# Patient Record
Sex: Female | Born: 1995 | Race: White | Hispanic: No | Marital: Single | Smoking: Never smoker
Health system: Southern US, Community
[De-identification: ages and names within clinical notes are randomized; demographics above are authoritative.]

## PROBLEM LIST (undated history)

## (undated) DIAGNOSIS — L709 Acne, unspecified: Secondary | ICD-10-CM

## (undated) DIAGNOSIS — E785 Hyperlipidemia, unspecified: Secondary | ICD-10-CM

## (undated) DIAGNOSIS — Z8742 Personal history of other diseases of the female genital tract: Secondary | ICD-10-CM

## (undated) DIAGNOSIS — F419 Anxiety disorder, unspecified: Secondary | ICD-10-CM

## (undated) HISTORY — PX: OTHER SURGICAL HISTORY: SHX169

## (undated) HISTORY — DX: Personal history of other diseases of the female genital tract: Z87.42

## (undated) HISTORY — DX: Anxiety disorder, unspecified: F41.9

## (undated) HISTORY — DX: Hyperlipidemia, unspecified: E78.5

## (undated) HISTORY — DX: Acne, unspecified: L70.9

---
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
## 2016-05-18 ENCOUNTER — Other Ambulatory Visit: Payer: Self-pay | Admitting: Obstetrics and Gynecology

## 2016-05-18 ENCOUNTER — Ambulatory Visit (INDEPENDENT_AMBULATORY_CARE_PROVIDER_SITE_OTHER): Payer: Commercial Managed Care - PPO | Admitting: Obstetrics and Gynecology

## 2016-05-18 ENCOUNTER — Encounter: Payer: Self-pay | Admitting: Obstetrics and Gynecology

## 2016-05-18 VITALS — BP 116/60 | HR 66 | Resp 20 | Ht 64.0 in | Wt 127.2 lb

## 2016-05-18 DIAGNOSIS — E282 Polycystic ovarian syndrome: Secondary | ICD-10-CM

## 2016-05-18 DIAGNOSIS — L680 Hirsutism: Secondary | ICD-10-CM | POA: Diagnosis not present

## 2016-05-18 MED ORDER — GILDAGIA 0.4-35 MG-MCG PO TABS: 1.0000 | ORAL_TABLET | Freq: Every day | ORAL | Status: DC

## 2016-05-18 NOTE — Patient Instructions (Signed)
Eflornithine skin cream What is this medicine? EFLORNITHINE (ee FLOR ni theen) is used on the skin to reduce unwanted facial hair in women. This medicine may be used for other purposes; ask your health care provider or pharmacist if you have questions. What should I tell my health care provider before I take this medicine? They need to know if you have any of these conditions: -an unusual or allergic reaction to eflornithine, other medicines, foods, dyes, or preservatives -pregnant or trying to get pregnant -breast-feeding How should I use this medicine? This medicine is for external use only. Do not take by mouth. Follow the direction on the prescription label. Wash your hands before and after use. Apply a thin film of cream to the affected areas of the face and chin twice a day (at least 8 hours apart), or as often as directed by your doctor or health care professional. Rub the cream in thoroughly. Do not wash the treatment area for at least 4 hours after application of the cream. Wait a few minutes after applying this cream before you apply cosmetics or sunscreens. This cream does not permanently remove unwanted facial hair. Continue to use your normal method for hair removal until desired results have been achieved. Do not use your medicine more often than directed. Talk to your pediatrician regarding the use of this medicine in children. Special care may be needed. While this drug may be prescribed for girls as young as 19 years of age for selected conditions, precautions do apply. Overdosage: If you think you have taken too much of this medicine contact a poison control center or emergency room at once. NOTE: This medicine is only for you. Do not share this medicine with others. What if I miss a dose? If you miss a dose, use it as soon as you can. If it is almost time for your next dose, use only that dose. Do not use double or extra doses. What may interact with this medicine? Interactions  are not expected. Do not use any other skin products on the affected area without asking your doctor or health care professional. This list may not describe all possible interactions. Give your health care provider a list of all the medicines, herbs, non-prescription drugs, or dietary supplements you use. Also tell them if you smoke, drink alcohol, or use illegal drugs. Some items may interact with your medicine. What should I watch for while using this medicine? Improvement of the condition occurs gradually. It may take 4 to 8 weeks. If there is no improvement after 6 months, talk to your doctor about stopping this medicine. About 8 weeks after stopping treatment with this medicine, the hair will return to the same condition as before treatment. What side effects may I notice from receiving this medicine? Side effects that you should report to your doctor or health care professional as soon as possible: -allergic reactions like skin rash, itching or hives, swelling of the face, lips, or tongue Side effects that usually do not require medical attention (report to your doctor or health care professional if they continue or are bothersome): -hair bumps -redness -stinging or burning This list may not describe all possible side effects. Call your doctor for medical advice about side effects. You may report side effects to FDA at 1-800-FDA-1088. Where should I keep my medicine? Keep out of the reach of children. Store at room temperature between 15 and 30 degrees C (59 and 86 degrees F). Do not freeze. Throw away any  unused medicine after the expiration date. NOTE: This sheet is a summary. It may not cover all possible information. If you have questions about this medicine, talk to your doctor, pharmacist, or health care provider.    2016, Elsevier/Gold Standard. (2008-03-26 17:12:20)  Spironolactone tablets What is this medicine? SPIRONOLACTONE (speer on oh LAK tone) is a diuretic. It helps you  make more urine and to lose excess water from your body. This medicine is used to treat high blood pressure, and edema or swelling from heart, kidney, or liver disease. It is also used to treat patients who make too much aldosterone or have low potassium. This medicine may be used for other purposes; ask your health care provider or pharmacist if you have questions. What should I tell my health care provider before I take this medicine? They need to know if you have any of these conditions: -high blood level of potassium -kidney disease or trouble making urine -liver disease -an unusual or allergic reaction to spironolactone, other medicines, foods, dyes, or preservatives -pregnant or trying to get pregnant -breast-feeding How should I use this medicine? Take this medicine by mouth with a drink of water. Follow the directions on your prescription label. You can take it with or without food. If it upsets your stomach, take it with food. Do not take your medicine more often than directed. Remember that you will need to pass more urine after taking this medicine. Do not take your doses at a time of day that will cause you problems. Do not take at bedtime. Talk to your pediatrician regarding the use of this medicine in children. While this drug may be prescribed for selected conditions, precautions do apply. Overdosage: If you think you have taken too much of this medicine contact a poison control center or emergency room at once. NOTE: This medicine is only for you. Do not share this medicine with others. What if I miss a dose? If you miss a dose, take it as soon as you can. If it is almost time for your next dose, take only that dose. Do not take double or extra doses. What may interact with this medicine? Do not take this medicine with any of the following medications: -eplerenone This medicine may also interact with the following medications: -corticosteroids -digoxin -lithium -medicines for  high blood pressure like ACE inhibitors -skeletal muscle relaxants like tubocurarine -NSAIDs, medicines for pain and inflammation, like ibuprofen or naproxen -potassium products like salt substitute or supplements -pressor amines like norepinephrine -some diuretics This list may not describe all possible interactions. Give your health care provider a list of all the medicines, herbs, non-prescription drugs, or dietary supplements you use. Also tell them if you smoke, drink alcohol, or use illegal drugs. Some items may interact with your medicine. What should I watch for while using this medicine? Visit your doctor or health care professional for regular checks on your progress. Check your blood pressure as directed. Ask your doctor what your blood pressure should be, and when you should contact them. You may need to be on a special diet while taking this medicine. Ask your doctor. Also, ask how many glasses of fluid you need to drink a day. You must not get dehydrated. This medicine may make you feel confused, dizzy or lightheaded. Drinking alcohol and taking some medicines can make this worse. Do not drive, use machinery, or do anything that needs mental alertness until you know how this medicine affects you. Do not sit or stand  up quickly. What side effects may I notice from receiving this medicine? Side effects that you should report to your doctor or health care professional as soon as possible: -allergic reactions such as skin rash or itching, hives, swelling of the lips, mouth, tongue, or throat -black or tarry stools -fast, irregular heartbeat -fever -muscle pain, cramps -numbness, tingling in hands or feet -trouble breathing -trouble passing urine -unusual bleeding -unusually weak or tired Side effects that usually do not require medical attention (report to your doctor or health care professional if they continue or are bothersome): -change in voice or hair  growth -confusion -dizzy, drowsy -dry mouth, increased thirst -enlarged or tender breasts -headache -irregular menstrual periods -sexual difficulty, unable to have an erection -stomach upset This list may not describe all possible side effects. Call your doctor for medical advice about side effects. You may report side effects to FDA at 1-800-FDA-1088. Where should I keep my medicine? Keep out of the reach of children. Store below 25 degrees C (77 degrees F). Throw away any unused medicine after the expiration date. NOTE: This sheet is a summary. It may not cover all possible information. If you have questions about this medicine, talk to your doctor, pharmacist, or health care provider.    2016, Elsevier/Gold Standard. (2010-08-05 12:51:30)

## 2016-05-18 NOTE — Progress Notes (Addendum)
Patient ID: Autumn Mills, female   DOB: [birth year], 20 y.o.   MRN: [id number] GYNECOLOGY  VISIT   HPI: 20 y.o.   Single  Caucasian  female   G0P0 with Patient's last menstrual period was 05/05/2016 (exact date).   here to establish care for PCOS.   Has PCOS for about one year. Has facial hair.  Has not done electrolysis or waxing.  A little midline chest hair.  Menses have always been regular; "intense" when off OCPs.  Saw Elon Alas previously. Had ultrasound in 2015 - multiple bilateral ovarian follicles. Labs at that time: Testosterone 49.  DHEA and TSH normal. Started OCPs. Declined spironolactone.  Has been treating with birth control pills but recently ran out.  Just went back on them one month ago.   Prefers to take less medication versus more.   Patient's PCP is "[facility]" at Dr. Ray Church office.  Had routine blood work, which is not back yet.  No hormonal testing was done.  Has skin checks yearly at [facility].   Studying history and Spanish.  Student At [employer]. Working at her Engineering geologist office for the summer.   GYNECOLOGIC HISTORY: Patient's last menstrual period was 05/05/2016 (exact date).  Never sexually active.  Female preference. Contraception:  OCPs--Gildagia Menopausal hormone therapy:  n/a Last mammogram:  n/a Last pap smear:   n/a        OB History    Gravida Para Term Preterm AB TAB SAB Ectopic Multiple Living   0                  Patient Active Problem List   Diagnosis Date Noted  . Hirsutism 07/17/2014    Past Medical History  Diagnosis Date  . Acne   . History of PCOS     Past Surgical History  Procedure Laterality Date  . Injury surg    . Other surgical history Left     golf tee removed from left buttocks age 68    Current Outpatient Prescriptions  Medication Sig Dispense Refill  . GILDAGIA 0.4-35 MG-MCG tablet Take 1 tablet by mouth daily. Reported on 05/18/2016     No current facility-administered medications  for this visit.     ALLERGIES: Review of patient's allergies indicates no known allergies.  Family History  Problem Relation Age of Onset  . Hyperlipidemia Father   . Lupus Maternal Aunt   . Thyroid disease Maternal Grandmother     hashimotos  . Heart disease Maternal Grandmother   . Hyperlipidemia Maternal Grandmother   . Hypertension Maternal Grandmother   . Cancer Maternal Grandfather     Bone  . Multiple sclerosis Paternal Grandmother   . Cancer Paternal Grandfather     Prostate  . Migraines Mother     Social History   Social History  . Marital Status: Single    Spouse Name: N/A  . Number of Children: N/A  . Years of Education: N/A   Occupational History  . Not on file.   Social History Main Topics  . Smoking status: Never Smoker   . Smokeless tobacco: Not on file  . Alcohol Use: No  . Drug Use: No  . Sexual Activity: No     Comment: no sexually active   Other Topics Concern  . Not on file   Social History Narrative    ROS:  Pertinent items are noted in HPI.  PHYSICAL EXAMINATION:    BP 116/60 mmHg  Pulse 66  Resp 20  Ht 5\' 4"  (1.626 m)  Wt 127 lb 3.2 oz (57.698 kg)  BMI 21.82 kg/m2  LMP 05/05/2016 (Exact Date)    General appearance: alert, cooperative and appears stated age Head: Normocephalic, without obvious abnormality, atraumatic Neck: no adenopathy, supple, symmetrical, trachea midline and thyroid normal to inspection and palpation Lungs: clear to auscultation bilaterally Breasts: normal appearance, no masses or tenderness, Inspection negative, No nipple retraction or dimpling, No nipple discharge or bleeding, No axillary or supraclavicular adenopathy, aroelar hair.  Few midline chest haris.  Heart: regular rate and rhythm Abdomen: incision(s):No.   soft, non-tender, no masses,  no organomegaly Extremities: extremities normal, atraumatic, no cyanosis or edema Skin: Acne noted, skin color, texture, turgor normal. No rashes or lesions Lymph  nodes: Cervical, supraclavicular, and axillary nodes normal. No abnormal inguinal nodes palpated Neurologic: Grossly normal  Pelvic: External genitalia:   Tanner Stage V. No full pelvic exam.   Chaperone was present for exam.  ASSESSMENT  Polycystic ovarian disease. Hx mildly elevated total testosterone. Hirsutism.  Acne.   PLAN  Discussion of PCOS.  Check testosterone - free and total, prolactin level.   Believes she had cholesterol, blood counts, blood chemistry testing, and thyroid testing at PCP.  We will get a copy of results. Continue OCPs.  Discussed Vaniqa, spironolactone, and laser/electrolysis for hair growth.   Patient will pursue hair removal through [facility]. Breast self exam taught.  Follow up yearly for annual exams and prn.    An After Visit Summary was printed and given to the patient.  __30____ minutes face to face time of which over 50% was spent in counseling.   Addendum:  Patient's mother was present for the history and discussion portion of the visit.  She was briefly excused so that I could interview the patient alone as well.

## 2016-05-19 LAB — PROLACTIN: Prolactin: 7.8 ng/mL

## 2016-05-21 LAB — TESTOSTERONE, TOTAL, LC/MS/MS: Testosterone, Total, LC-MS-MS: 36 ng/dL (ref 2–45)

## 2016-05-28 ENCOUNTER — Telehealth: Payer: Self-pay

## 2016-05-28 NOTE — Telephone Encounter (Signed)
Called patient at [phone] to discuss lab results; left message for patient to call me.

## 2016-05-28 NOTE — Telephone Encounter (Signed)
Patient notified--see Result Note.

## 2016-05-28 NOTE — Telephone Encounter (Signed)
-----   Message from Nunzio Cobbs, MD sent at 05/26/2016  6:16 PM EDT ----- Please inform patient of her normal total testosterone and prolactin levels.  I am trying to have the lab run a free testosterone also.

## 2016-05-29 LAB — TESTOS,TOTAL,FREE AND SHBG (FEMALE)
SEX HORMONE BINDING GLOB.: 51 nmol/L (ref 17–124)
Testosterone, Free: 3 pg/mL (ref 0.1–6.4)
Testosterone,Total,LC/MS/MS: 40 ng/dL (ref 2–45)

## 2017-04-14 DIAGNOSIS — Z01419 Encounter for gynecological examination (general) (routine) without abnormal findings: Secondary | ICD-10-CM | POA: Diagnosis not present

## 2017-04-14 DIAGNOSIS — Z6820 Body mass index (BMI) 20.0-20.9, adult: Secondary | ICD-10-CM | POA: Diagnosis not present

## 2017-04-21 ENCOUNTER — Encounter: Payer: Self-pay | Admitting: Gynecology

## 2017-05-05 DIAGNOSIS — E559 Vitamin D deficiency, unspecified: Secondary | ICD-10-CM | POA: Diagnosis not present

## 2017-05-05 DIAGNOSIS — E7800 Pure hypercholesterolemia, unspecified: Secondary | ICD-10-CM | POA: Diagnosis not present

## 2017-05-05 DIAGNOSIS — Z0001 Encounter for general adult medical examination with abnormal findings: Secondary | ICD-10-CM | POA: Diagnosis not present

## 2017-05-05 DIAGNOSIS — Z0000 Encounter for general adult medical examination without abnormal findings: Secondary | ICD-10-CM | POA: Diagnosis not present

## 2017-05-11 ENCOUNTER — Other Ambulatory Visit: Payer: Self-pay | Admitting: Obstetrics and Gynecology

## 2017-05-11 NOTE — Telephone Encounter (Signed)
Pt needs AEX scheduled.  #84/0RF done for pt.

## 2017-05-11 NOTE — Telephone Encounter (Signed)
Medication refill request: [id number]  Last AEX:  05/18/16 BS Next AEX: none scheduled Last MMG (if hormonal medication request): n/a Refill authorized: 05/18/16 #3packs w/3 refills; today please advise; Dr. Quincy Simmonds out of office  Left VM for patient to call our office back and schedule AEX.

## 2017-05-12 DIAGNOSIS — Z0000 Encounter for general adult medical examination without abnormal findings: Secondary | ICD-10-CM | POA: Diagnosis not present

## 2017-05-13 DIAGNOSIS — L03031 Cellulitis of right toe: Secondary | ICD-10-CM | POA: Diagnosis not present

## 2017-05-13 DIAGNOSIS — B351 Tinea unguium: Secondary | ICD-10-CM | POA: Diagnosis not present

## 2017-05-13 DIAGNOSIS — M898X9 Other specified disorders of bone, unspecified site: Secondary | ICD-10-CM | POA: Diagnosis not present

## 2017-06-16 DIAGNOSIS — B351 Tinea unguium: Secondary | ICD-10-CM | POA: Diagnosis not present

## 2018-05-17 DIAGNOSIS — L700 Acne vulgaris: Secondary | ICD-10-CM | POA: Diagnosis not present

## 2018-05-26 DIAGNOSIS — Z79899 Other long term (current) drug therapy: Secondary | ICD-10-CM | POA: Diagnosis not present

## 2018-05-26 DIAGNOSIS — L700 Acne vulgaris: Secondary | ICD-10-CM | POA: Diagnosis not present

## 2018-06-28 DIAGNOSIS — L700 Acne vulgaris: Secondary | ICD-10-CM | POA: Diagnosis not present

## 2018-06-28 DIAGNOSIS — Z79899 Other long term (current) drug therapy: Secondary | ICD-10-CM | POA: Diagnosis not present

## 2018-07-18 DIAGNOSIS — Z6820 Body mass index (BMI) 20.0-20.9, adult: Secondary | ICD-10-CM | POA: Diagnosis not present

## 2018-07-18 DIAGNOSIS — Z01419 Encounter for gynecological examination (general) (routine) without abnormal findings: Secondary | ICD-10-CM | POA: Diagnosis not present

## 2018-11-24 DIAGNOSIS — D225 Melanocytic nevi of trunk: Secondary | ICD-10-CM | POA: Diagnosis not present

## 2018-11-24 DIAGNOSIS — B351 Tinea unguium: Secondary | ICD-10-CM | POA: Diagnosis not present

## 2018-11-24 DIAGNOSIS — D2261 Melanocytic nevi of right upper limb, including shoulder: Secondary | ICD-10-CM | POA: Diagnosis not present

## 2019-11-23 ENCOUNTER — Other Ambulatory Visit: Payer: Self-pay | Admitting: Internal Medicine

## 2019-11-23 DIAGNOSIS — R002 Palpitations: Secondary | ICD-10-CM

## 2019-12-29 ENCOUNTER — Other Ambulatory Visit: Payer: Self-pay | Admitting: Internal Medicine

## 2019-12-29 DIAGNOSIS — R002 Palpitations: Secondary | ICD-10-CM

## 2020-01-01 ENCOUNTER — Ambulatory Visit: Payer: Managed Care, Other (non HMO)

## 2020-01-01 ENCOUNTER — Other Ambulatory Visit: Payer: Self-pay

## 2020-01-01 DIAGNOSIS — R002 Palpitations: Secondary | ICD-10-CM

## 2020-11-08 ENCOUNTER — Ambulatory Visit: Payer: Self-pay | Attending: Internal Medicine

## 2020-11-08 DIAGNOSIS — Z23 Encounter for immunization: Secondary | ICD-10-CM

## 2020-11-08 NOTE — Progress Notes (Signed)
° °  Covid-19 Vaccination Clinic  Name:  Autumn Mills    MRN: [id number] DOB: Jul 03, 1996  11/08/2020  Ms. Orser was observed post Covid-19 immunization for 15 minutes without incident. She was provided with Vaccine Information Sheet and instruction to access the V-Safe system.   Ms. Waldvogel was instructed to call 911 with any severe reactions post vaccine:  Difficulty breathing   Swelling of face and throat   A fast heartbeat   A bad rash all over body   Dizziness and weakness   Immunizations Administered    No immunizations on file.

## 2022-07-14 ENCOUNTER — Ambulatory Visit: Payer: 59 | Admitting: Physician Assistant

## 2022-07-28 ENCOUNTER — Encounter: Payer: Self-pay | Admitting: Physician Assistant

## 2022-07-28 ENCOUNTER — Ambulatory Visit: Payer: 59 | Admitting: Physician Assistant

## 2022-07-28 VITALS — BP 120/70 | HR 78 | Temp 98.3°F | Ht 65.0 in | Wt 126.2 lb

## 2022-07-28 DIAGNOSIS — F526 Dyspareunia not due to a substance or known physiological condition: Secondary | ICD-10-CM | POA: Diagnosis not present

## 2022-07-28 DIAGNOSIS — B351 Tinea unguium: Secondary | ICD-10-CM

## 2022-07-28 DIAGNOSIS — E282 Polycystic ovarian syndrome: Secondary | ICD-10-CM

## 2022-07-28 DIAGNOSIS — Z124 Encounter for screening for malignant neoplasm of cervix: Secondary | ICD-10-CM

## 2022-07-28 DIAGNOSIS — M2550 Pain in unspecified joint: Secondary | ICD-10-CM

## 2022-07-28 DIAGNOSIS — R4589 Other symptoms and signs involving emotional state: Secondary | ICD-10-CM | POA: Insufficient documentation

## 2022-07-28 DIAGNOSIS — F418 Other specified anxiety disorders: Secondary | ICD-10-CM

## 2022-07-28 MED ORDER — MELOXICAM 7.5 MG PO TABS: 7.5000 mg | ORAL_TABLET | Freq: Every day | ORAL | 0 refills | Status: DC

## 2022-07-28 NOTE — Progress Notes (Signed)
Autumn Mills is a 26 y.o. female here for a follow up of a pre-existing problem.  History of Present Illness:   Chief Complaint  Patient presents with   Establish Care   Joint Pain    Pt c/o joint pain since May when she was put on anti-fungal medication stopped medication and is still having joint pain shoulders, wrists, stiff fingers left hand ring and middle fingers, knees.   Patient is present with her mother Autumn Mills  HPI  Arthralgias Patient has been dealing with toenail fungus on bilateral great toes for several years.  She discussed this with her primary care provider and started terbinafine daily.  She took this for 2 to 3 weeks and then noticed that she was having some joint pain and stiffness around her right knee and upper thigh.  She saw an orthopedist and was told that she might have some tendinitis.  She had an x-ray of her right leg and this was normal.  The orthopedist told her to stop the terbinafine as it could have potentially been related.  Since that time she has had ongoing joint and muscle pain.  She gets throbbing pain and stiffness.  Her symptoms are in her arms, wrists, shoulders.  She is not experience any redness or swelling.  She trialed naproxen and this helped her symptoms and then she was told to exercise and had improvement in symptoms with this as well.  She went to a hand and shoulder doctor in mid July and he told her that he was unsure of what was causing her symptoms.  She has been taking meloxicam 15 mg daily instead of the naproxen and this is helping her symptoms.  She has several braces with her today, she is wearing 1 on her shoulder, and she also wears 1 on he either her hand or wrist depending on severity of symptoms.  Her mother sees integrative provider and Autumn Mills has also recently seen this provider and has standing blood work to get done to assess for underlying causes of her symptoms but has yet to have this blood work done.  Autumn Mills's  mother suffers from fibromyalgia.  Bilateral great toenail fungus Has ongoing issues with her toenail fungus.  She has a follow-up appointment scheduled with the podiatrist to talk about possibly using lasers to help improve the appearance of her toenails.  PCOS Currently taking ortho-cyclen and spironolactone 100 mg daily. She was told that she has a "touch" of PCOS. Had ovarian cysts found on ultrasound. Currently taking spironolactone 100 mg.  She has been unable to have a Pap smear due to severe pain with attempts at the speculum exam.  She is looking for a new gynecologist.  Health Anxiety Has severe health anxiety.  Her mother reports that when she is having physical symptoms that her mental health spins out of control as she tries to figure out what is wrong with her.  Patient endorses significant health anxiety at times.  She has never been on medication.  She saw a talk therapist once and it was not a good fit for her.  She denies any suicidal or homicidal ideation.    Past Medical History:  Diagnosis Date   Anxiety    Hyperlipidemia      Social History   Tobacco Use   Smoking status: Never   Smokeless tobacco: Never  Vaping Use   Vaping Use: Never used  Substance Use Topics   Alcohol use: Not Currently   Drug use: Never  Past Surgical History:  Procedure Laterality Date   gluteal surgery Left 2004    History reviewed. No pertinent family history.  No Known Allergies  Current Medications:   Current Outpatient Medications:    meloxicam (MOBIC) 15 MG tablet, Take 15 mg by mouth daily., Disp: , Rfl:    norgestimate-ethinyl estradiol (ORTHO-CYCLEN) 0.25-35 MG-MCG tablet, Take 1 tablet by mouth daily., Disp: , Rfl:    spironolactone (ALDACTONE) 100 MG tablet, Take 100 mg by mouth daily., Disp: , Rfl:    Review of Systems:   ROS Negative unless otherwise specified per HPI.   Vitals:   Vitals:   07/28/22 1335  BP: 120/70  Pulse: 78  Temp: 98.3 F (36.8  C)  TempSrc: Temporal  SpO2: 98%  Weight: 126 lb 4 oz (57.3 kg)  Height: '5\' 5"'$  (1.651 m)     Body mass index is 21.01 kg/m.  Physical Exam:   Physical Exam Vitals and nursing note reviewed.  Constitutional:      General: She is not in acute distress.    Appearance: She is well-developed. She is not ill-appearing or toxic-appearing.  Cardiovascular:     Rate and Rhythm: Normal rate and regular rhythm.     Pulses: Normal pulses.     Heart sounds: Normal heart sounds, S1 normal and S2 normal.  Pulmonary:     Effort: Pulmonary effort is normal.     Breath sounds: Normal breath sounds.  Musculoskeletal:     Comments: Normal range of motion of bilateral extremities No swelling or erythema at joints  Feet:     Comments: Bilateral great toes with thickened and dystrophic nails, right > left Skin:    General: Skin is warm and dry.  Neurological:     Mental Status: She is alert.     GCS: GCS eye subscore is 4. GCS verbal subscore is 5. GCS motor subscore is 6.     Comments: Upper and lower strength 5 out of 5 bilaterally  Psychiatric:        Speech: Speech normal.        Behavior: Behavior normal. Behavior is cooperative.     Assessment and Plan:   Arthralgia, unspecified joint Unclear etiology I told her that it is reassuring that ibuprofen and meloxicam help her symptoms, as well as exercise We are going to reduce her meloxicam to 7.5 mg daily to see if this is sufficient She is to pursue blood work with her integrative provider and let us know the results I would like to send her to sports medicine and/or physical therapy for further evaluation, however we are going to await the blood work results first to make sure she does not need to see rheumatology  Toenail fungus She is going to follow-up with her podiatrist for further evaluation of this  PCOS (polycystic ovarian syndrome) She has concerns about being on birth control for so long to work and refer her to  gynecology to discuss this  Genito-pelvic pain related to vaginal penetration; Pap smear for cervical cancer screening She is unable to tolerate a pelvic exam, I will refer her to gynecology to pursue further evaluation of this and so she can have her Pap smear performed  Anxiety about health Uncontrolled I did give her a talk therapy card for Larene [place] at [facility] counseling to see if this can help connect her with a therapist She declines medication intervention at this time I recommend that she reach out if she has worsening of symptoms  I discussed with patient that if they develop any SI, to tell someone immediately and seek medical attention.  Time spent with patient today was 50 minutes which consisted of chart review, discussing diagnosis, work up, treatment answering questions and documentation.  Inda Coke, PA-C

## 2022-07-28 NOTE — Patient Instructions (Signed)
It was great to see you!  Get your blood work done and send me a Pharmacist, community message with those results.  Let's consider PT and/or sports medicine.  I will place referral for [facility] to help [place] get your pap smear done.  Try to work on your health anxiety. Let me know if there is anything I can do to help you.  Let's try a reduced dose of Meloxicam 7.5 mg daily. Start daily nexium or prilosec (generic is fine!) to protect your stomach while on this  Follow-up as needed.  Take care,  Inda Coke PA-C

## 2022-08-13 ENCOUNTER — Encounter: Payer: Self-pay | Admitting: Physician Assistant

## 2022-08-14 ENCOUNTER — Other Ambulatory Visit: Payer: Self-pay | Admitting: Physician Assistant

## 2022-08-14 DIAGNOSIS — M2550 Pain in unspecified joint: Secondary | ICD-10-CM

## 2022-08-14 NOTE — Telephone Encounter (Signed)
Pt states: -she'd like to have these labs taken at a [facility] in [place].  Pt requests: -orders to be sent there. -follow up as needed with patient.  [facility] [address] [place],  [place], [place] [address] Phone: [phone] Fax: [phone]

## 2022-08-20 LAB — CBC WITH DIFFERENTIAL/PLATELET
Basophils Absolute: 0 10*3/uL (ref 0.0–0.2)
Basos: 0 %
EOS (ABSOLUTE): 0.1 10*3/uL (ref 0.0–0.4)
Eos: 3 %
Hematocrit: 41.7 % (ref 34.0–46.6)
Hemoglobin: 13.9 g/dL (ref 11.1–15.9)
Immature Grans (Abs): 0 10*3/uL (ref 0.0–0.1)
Immature Granulocytes: 0 %
Lymphocytes Absolute: 1.9 10*3/uL (ref 0.7–3.1)
Lymphs: 41 %
MCH: 31.5 pg (ref 26.6–33.0)
MCHC: 33.3 g/dL (ref 31.5–35.7)
MCV: 95 fL (ref 79–97)
Monocytes Absolute: 0.5 10*3/uL (ref 0.1–0.9)
Monocytes: 10 %
Neutrophils Absolute: 2.1 10*3/uL (ref 1.4–7.0)
Neutrophils: 46 %
Platelets: 247 10*3/uL (ref 150–450)
RBC: 4.41 x10E6/uL (ref 3.77–5.28)
RDW: 11.8 % (ref 11.7–15.4)
WBC: 4.7 10*3/uL (ref 3.4–10.8)

## 2022-08-20 LAB — COMPREHENSIVE METABOLIC PANEL
ALT: 15 IU/L (ref 0–32)
AST: 17 IU/L (ref 0–40)
Albumin/Globulin Ratio: 1.7 (ref 1.2–2.2)
Albumin: 4.6 g/dL (ref 4.0–5.0)
Alkaline Phosphatase: 60 IU/L (ref 44–121)
BUN/Creatinine Ratio: 15 (ref 9–23)
BUN: 12 mg/dL (ref 6–20)
Bilirubin Total: 0.4 mg/dL (ref 0.0–1.2)
CO2: 24 mmol/L (ref 20–29)
Calcium: 9.9 mg/dL (ref 8.7–10.2)
Chloride: 103 mmol/L (ref 96–106)
Creatinine, Ser: 0.81 mg/dL (ref 0.57–1.00)
Globulin, Total: 2.7 g/dL (ref 1.5–4.5)
Glucose: 83 mg/dL (ref 70–99)
Potassium: 4.5 mmol/L (ref 3.5–5.2)
Sodium: 139 mmol/L (ref 134–144)
Total Protein: 7.3 g/dL (ref 6.0–8.5)
eGFR: 103 mL/min/{1.73_m2} (ref >59)

## 2022-08-20 LAB — RHEUMATOID FACTOR: Rheumatoid fact SerPl-aCnc: <10 IU/mL (ref <14.0)

## 2022-08-20 LAB — TSH: TSH: 0.833 u[IU]/mL (ref 0.450–4.500)

## 2022-08-20 LAB — ANA: Anti Nuclear Antibody (ANA): NEGATIVE

## 2022-08-20 LAB — SEDIMENTATION RATE: Sed Rate: 4 mm/hr (ref 0–32)

## 2022-08-20 LAB — VITAMIN B12: Vitamin B-12: 413 pg/mL (ref 232–1245)

## 2022-08-20 LAB — CYCLIC CITRUL PEPTIDE ANTIBODY, IGG/IGA: Cyclic Citrullin Peptide Ab: 4 units (ref 0–19)

## 2022-08-20 LAB — C-REACTIVE PROTEIN: CRP: <1 mg/L (ref 0–10)

## 2022-08-20 LAB — VITAMIN D 25 HYDROXY (VIT D DEFICIENCY, FRACTURES): Vit D, 25-Hydroxy: 31.2 ng/mL (ref 30.0–100.0)

## 2022-08-26 ENCOUNTER — Other Ambulatory Visit: Payer: Self-pay | Admitting: Physician Assistant

## 2022-09-01 ENCOUNTER — Ambulatory Visit: Payer: 59 | Admitting: Obstetrics and Gynecology

## 2022-09-01 ENCOUNTER — Encounter: Payer: Self-pay | Admitting: Obstetrics and Gynecology

## 2022-09-01 VITALS — BP 110/72 | HR 90 | Ht 63.5 in | Wt 122.8 lb

## 2022-09-01 DIAGNOSIS — N94819 Vulvodynia, unspecified: Secondary | ICD-10-CM

## 2022-09-01 MED ORDER — LIDOCAINE 5 % EX OINT: 1.0000 | TOPICAL_OINTMENT | Freq: Three times a day (TID) | CUTANEOUS | 0 refills | Status: AC

## 2022-09-01 NOTE — Progress Notes (Signed)
26 y.o. No obstetric history on file. Single White or Caucasian Not Hispanic or Latino female here for PCOS. Referral from [facility]. Note states that patient can not tolerate pap smear due to pain with attempt at a pelvic exam. She states that she can not wear tampons.  Occasional discomfort at her introitus, thinks from wiping too hard. Never tried to be sexually active. No abnormal d/c, no itching, burning or irritation.  Period Cycle (Days): 28 Period Duration (Days): 5-7 Period Pattern: Regular Menstrual Flow: Moderate Menstrual Control: Thin pad Menstrual Control Change Freq (Hours): 6 Dysmenorrhea: (!) Moderate Dysmenorrhea Symptoms: Cramping Ibuprofen helps her cramps.   H/O PCOS, diagnosed in 2015 with u/s and mildly elevated testosterone.   Patient's last menstrual period was 08/18/2022.          Sexually active: No.  The current method of family planning is OCP (estrogen/progesterone).    Exercising: Yes.     Walking 2-5 miles a day  Smoker:  no  Health Maintenance: Pap:  never  History of abnormal Pap:  n/a MMG:  n/a BMD:   n/a Colonoscopy: n/a TDaP:  07/16/2008 Gardasil: complete    reports that she has never smoked. She has never used smokeless tobacco. She reports that she does not currently use alcohol. She reports that she does not use drugs. Rare ETOH. She is a para legal.   Past Medical History:  Diagnosis Date   Acne    Anxiety    History of PCOS    Hyperlipidemia     Past Surgical History:  Procedure Laterality Date   gluteal surgery Left 2004   Injury surg     OTHER SURGICAL HISTORY Left    golf tee removed from left buttocks age 68    Current Outpatient Medications  Medication Sig Dispense Refill   norgestimate-ethinyl estradiol (ORTHO-CYCLEN) 0.25-35 MG-MCG tablet Take 1 tablet by mouth daily.     spironolactone (ALDACTONE) 100 MG tablet Take 100 mg by mouth daily.     No current facility-administered medications for this visit.  On  spironolactone for acne.   Family History  Problem Relation Age of Onset   Fibromyalgia Mother    Hyperlipidemia Father    Lupus Maternal Aunt    Thyroid disease Maternal Grandmother        hashimotos   Heart disease Maternal Grandmother    Hyperlipidemia Maternal Grandmother    Hypertension Maternal Grandmother    Cancer Maternal Grandfather        Bone   Multiple sclerosis Paternal Grandmother    Cancer Paternal Grandfather        Prostate   Migraines Mother     Review of Systems  Genitourinary:  Positive for vaginal pain.  All other systems reviewed and are negative.   Exam:   BP 110/72   Pulse 90   Ht 5' 3.5" (1.613 m)   Wt 122 lb 12.8 oz (55.7 kg)   LMP 08/18/2022   SpO2 100%   BMI 21.41 kg/m   Weight change: '@WEIGHTCHANGE'$ @ Height:   Height: 5' 3.5" (161.3 cm)  Ht Readings from Last 3 Encounters:  09/01/22 5' 3.5" (1.613 m)  07/28/22 '5\' 5"'$  (1.651 m)  05/18/16 '5\' 4"'$  (1.626 m) (46 %, Z= -0.10)*   * Growth percentiles are based on CDC (Girls, 2-20 Years) data.    General appearance: alert, cooperative and appears stated age  Pelvic: External genitalia:  no lesions, no erythema, very tender with palpation of the introitus with a  lubricated cotton swab, normal appearing virginal hymen.                Urethra:  normal appearing urethra with no masses, tenderness or lesions              Bartholins and Skenes: normal                  Gae Dry chaperoned for the exam.  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 No baseline pain. No current plans to be sexually active.  - lidocaine (XYLOCAINE) 5 % ointment; Apply 1 Application topically 3 (three) times daily. Apply a pea sized amount topically 1 hour prior to an exam  Dispense: 30 g; Refill: 0 -Discussed vulvar skin care -Recommended she return for an annual exam/pap smear, would pre-treat with lidocaine. -Discussed possible treatment for vulvodynia, including: topical or  systemic medication and PT.  CC: Inda Coke, PA

## 2022-09-01 NOTE — Patient Instructions (Signed)
Vulvodynia  Vulvodynia is long-lasting (chronic) pain in the external female genitalia (vulva). This condition may also be referred to as vulvar pain. The vulva includes tissues surrounding the vaginal opening and the urethra. There are two main types of vulvar pain: Localized vulvar pain. This is pain that affects a specific area of the vulva. Vulvar vestibulitis syndrome (VVS) is a type of localized vulvar pain in which pain is only felt around the opening (vestibule) of the vagina. Generalized vulvar pain. This is pain that affects: The entire vulva. Multiple areas of the vulva. One side of the vulva. What are the causes? The cause of this condition is often not known. Many factors may contribute to it. Vulvar pain may be a type of nerve pain (neuropathic pain). What increases the risk? You are more likely to develop this condition if you have: Pelvic floor dysfunction. This is a problem in the muscles of your pelvis. A disorder that affects a protein that is involved in inflammation in the body. An increased number of pain receptor nerves throughout the body. A painful bladder. Irritable bowel syndrome (IBS). Fibromyalgia. This is a condition that causes chronic pain in many parts of the body. Restless sleep. Depression. What are the signs or symptoms? The main symptom of this condition is pain that affects part of the vulva or the entire vulva. Pain may: Feel like a burning, tearing, stinging, or sharp, knife-like sensation. Be long-term and ongoing. Come and go (be intermittent). Get worse when touching or putting pressure on the vulva, such as when inserting a tampon, having sex, sitting for a long time, or wearing tight pants. Symptoms may also include: Swelling or redness of the vulva, perineum, or inner thighs. Psychological or emotional distress. How is this diagnosed? This condition may be diagnosed based on: Your symptoms and your medical history. Your health care  provider may ask questions about what causes or worsens your pain. A physical exam of your abdomen and pelvis. This may include a cotton swab test. During this test, your health care provider gently touches all areas of your vulva with a cotton swab to determine where you have pain. Tests of your vaginal fluid. These tests may be done to check for infection. How is this treated? Treatment for this condition depends on the cause and severity of your pain. Treatment may include: Caring for your skin in the vulvar area. Working with a health care provider who specializes in pain. Physical therapy. This may include doing pelvic floor exercises. Counseling (psychotherapy). This may include biofeedback. This is a type of therapy that helps you be more aware of your body's response to pain. It also helps you learn to deal with pain. Complementary medical therapies to help relieve pain, such as: Hypnotherapy. This means being put in a state of altered consciousness (hypnosis). Acupuncture. This is the insertion of needles into certain places on the skin. Surgery to remove affected parts of your vulva. Follow these instructions at home: Clothing Wear cotton underwear. Avoid wearing tight pants or pantyhose. Wash clothing in unscented, mild laundry detergent. Do not use fabric softeners or dryer sheets. Eating and drinking Avoid caffeine. Avoid foods that are highly processed or high in sugar. Self-care If any soaps, lotions, creams, or ointments cause or worsen your pain, do not use these products on the affected areas. Do not use feminine wipes or douches. Do not use scented body soaps, scented pads, or scented tampons. Do not use hot tubs or take hot baths. Clean  your vulvar skin with warm water only and pat the area dry. General instructions Take over-the-counter and prescription medicines only as told by your health care provider. If you have sex, consider using an unscented silicone-based or  oil-based lubricant. Keep all follow-up visits. This is important. Contact a health care provider if: Your pain gets worse or does not improve after starting treatment. You develop vaginal discharge that smells bad. Summary Vulvodynia is long-lasting (chronic) pain in the external female genitalia (vulva). This condition may also be referred to as vulvar pain. The vulva includes tissues surrounding the vaginal opening and the urethra. Symptoms of this condition may include swelling or redness of the vulva, perineum, or inner thighs. The cause of this condition is often not known. Many factors may contribute to it. Vulvar pain may be a type of nerve pain (neuropathic pain). Treatment depends on the cause and severity of your pain. It may include physical therapy, counseling, complementary medical therapies, or working with a pain specialist. Treatment may also include surgery to remove affected parts of your vulva. This information is not intended to replace advice given to you by your health care provider. Make sure you discuss any questions you have with your health care provider. Document Revised: 05/15/2020 Document Reviewed: 05/15/2020 Elsevier Patient Education  [place].

## 2024-06-16 ENCOUNTER — Ambulatory Visit: Admitting: Obstetrics and Gynecology

## 2024-08-17 ENCOUNTER — Ambulatory Visit: Admitting: Obstetrics and Gynecology
